# Patient Record
Sex: Female | Born: 1980 | Race: White | Hispanic: No | Marital: Married
Health system: Southern US, Community
[De-identification: ages and names within clinical notes are randomized; demographics above are authoritative.]

---

## 2020-11-26 ENCOUNTER — Emergency Department (HOSPITAL_COMMUNITY)
Admission: EM | Admit: 2020-11-26 | Discharge: 2020-11-26 | Disposition: A | Discharge status: Home / Self Care | Payer: BC Managed Care – PPO | Attending: Emergency Medicine | Admitting: Emergency Medicine

## 2020-11-26 ENCOUNTER — Encounter (HOSPITAL_COMMUNITY): Payer: Self-pay | Admitting: *Deleted

## 2020-11-26 ENCOUNTER — Emergency Department (HOSPITAL_COMMUNITY): Payer: BC Managed Care – PPO

## 2020-11-26 ENCOUNTER — Other Ambulatory Visit: Payer: Self-pay

## 2020-11-26 DIAGNOSIS — R0989 Other specified symptoms and signs involving the circulatory and respiratory systems: Secondary | ICD-10-CM | POA: Diagnosis not present

## 2020-11-26 DIAGNOSIS — R070 Pain in throat: Secondary | ICD-10-CM | POA: Diagnosis not present

## 2020-11-26 NOTE — ED Provider Notes (Signed)
[facility]-EMERGENCY DEPT Provider Note   CSN: [id number] Arrival date & time: 11/26/20  1433     History Chief Complaint  Patient presents with   Swallowed Foreign Body    Joyce Freeman is a 40 y.o. female presenting to the ED for throat irritation.  States that a week ago she ate fish.  She woke up yesterday feeling like there was a fishbone stuck in her throat.  She did not eat any fish the day before that.  She pizza for dinner the night before.  She tried to see if symptoms spontaneously improved but they did not so she decided to seek evaluation in the ER.  Denies any trouble breathing, trouble swallowing.  She states that this feels different than it usual sore throat because there is "something jabbing me in my throat."  No lip or tongue swelling.  No vomiting.  HPI     History reviewed. No pertinent past medical history.  There are no problems to display for this patient.   History reviewed. No pertinent surgical history.   OB History   No obstetric history on file.     No family history on file.     Home Medications Prior to Admission medications   Not on File    Allergies    Patient has no allergy information on record.  Review of Systems   Review of Systems  Constitutional:  Negative for chills and fever.  HENT:  Positive for sore throat.   Neurological:  Negative for weakness and numbness.   Physical Exam Updated Vital Signs BP 130/88   Pulse 71   Temp 98.3 F (36.8 C) (Oral)   Resp 20   LMP 11/20/2020   SpO2 100%   Physical Exam Vitals and nursing note reviewed.  Constitutional:      General: She is not in acute distress.    Appearance: She is well-developed. She is not diaphoretic.     Comments: Tolerating secretions.  No signs of respiratory distress.  HENT:     Head: Normocephalic and atraumatic.     Mouth/Throat:     Pharynx: Uvula midline. No oropharyngeal exudate.     Tonsils: 0 on the right. 0 on the left.      Comments: Patient does not appear to be in acute distress. No trismus or drooling present. No pooling of secretions. Patient is tolerating secretions and is not in respiratory distress. No neck pain or tenderness to palpation of the neck. Full active and passive range of motion of the neck. No evidence of RPA or PTA. Eyes:     General: No scleral icterus.    Conjunctiva/sclera: Conjunctivae normal.  Cardiovascular:     Rate and Rhythm: Normal rate and regular rhythm.     Heart sounds: Normal heart sounds.  Pulmonary:     Effort: Pulmonary effort is normal. No respiratory distress.  Musculoskeletal:     Cervical back: Normal range of motion.  Skin:    Findings: No rash.  Neurological:     Mental Status: She is alert.    ED Results / Procedures / Treatments   Labs (all labs ordered are listed, but only abnormal results are displayed) Labs Reviewed - No data to display  EKG None  Radiology No results found.  Procedures Procedures   Medications Ordered in ED Medications - No data to display  ED Course  I have reviewed the triage vital signs and the nursing notes.  Pertinent labs & imaging  results that were available during my care of the patient were reviewed by me and considered in my medical decision making (see chart for details).  Clinical Course as of 11/26/20 1942  Tue Nov 26, 2020  2751 CLINICAL DATA: Globus sensation  EXAM: CT NECK WITHOUT CONTRAST  TECHNIQUE: Multidetector CT imaging of the neck was performed following the standard protocol without intravenous contrast.  COMPARISON: None.  FINDINGS: PHARYNX AND LARYNX: The nasopharynx, oropharynx and larynx are normal. Visible portions of the oral cavity, tongue base and floor of mouth are normal. Normal epiglottis, vallecula and pyriform sinuses. The larynx is normal. No retropharyngeal abscess, effusion or lymphadenopathy.  SALIVARY GLANDS: Normal parotid, submandibular and  sublingual glands.  THYROID: Normal.  LYMPH NODES: No enlarged or abnormal density lymph nodes.  VASCULAR: Major cervical vessels are patent.  LIMITED INTRACRANIAL: Normal.  VISUALIZED ORBITS: Normal.  MASTOIDS AND VISUALIZED PARANASAL SINUSES: No fluid levels or advanced mucosal thickening. No mastoid effusion.  SKELETON: No bony spinal canal stenosis. No lytic or blastic lesions.  UPPER CHEST: Clear.  OTHER: None.  IMPRESSION: Normal CT of the neck. No foreign body visible in the aero digestive tract   Electronically Signed By: Deatra Robinson M.D. On: 11/26/2020 19:28 [HK]    Clinical Course User Index [HK] Dietrich Pates, PA-C   MDM Rules/Calculators/A&P                          40 year old female presenting to the ED for throat irritation.  She ate fish last week and is concerned that there is a fishbone stuck in her throat.  Started having the sensation yesterday but did not eat fish again recently.  On exam she is tolerating secretions.  No trismus or drooling.  No signs of respiratory distress.  Exam of the posterior oropharynx without any abnormalities.  I have low suspicion for food impaction as the meal that she ate was 1 week ago and she is tolerating her secretions.  I feel this is most likely due to irritation but will proceed with imaging to rule out other cause.  CT soft tissue neck without any foreign bodies or other abnormalities.  I suspect this could be irritation but no indication that this is a food impaction at this time.  Will refer to GI for symptoms persist.  In the meantime we will advise her to take over-the-counter medication as needed for pain.  She remains in no acute distress, tolerating secretions and no signs of respiratory distress.  Return precautions given.    Patient is hemodynamically stable, in NAD, and able to ambulate in the ED. Evaluation does not show pathology that would require ongoing emergent intervention or inpatient treatment. I  explained the diagnosis to the patient. Pain has been managed and has no complaints prior to discharge. Patient is comfortable with above plan and is stable for discharge at this time. All questions were answered prior to disposition. Strict return precautions for returning to the ED were discussed. Encouraged follow up with PCP.   An After Visit Summary was printed and given to the patient.   Portions of this note were generated with Scientist, clinical (histocompatibility and immunogenetics). Dictation errors may occur despite best attempts at proofreading.   Final Clinical Impression(s) / ED Diagnoses Final diagnoses:  Foreign body sensation in throat    Rx / DC Orders ED Discharge Orders     None        Dietrich Pates, PA-C 11/26/20 1942  Vanetta Mulders, MD 11/29/20 [phone]

## 2020-11-26 NOTE — ED Triage Notes (Signed)
Pt complains of sore throat since last night. She feels like it is from a fishbone that is stuck in her throat. She ate some fish last week.

## 2020-11-26 NOTE — Discharge Instructions (Addendum)
You can take Tylenol ibuprofen as needed for discomfort. You may benefit you to follow-up with a GI specialist listed below. Return to the ER if you start to experience trouble breathing, trouble swallowing, trouble opening your mouth.

## 2022-06-13 IMAGING — CT CT NECK W/O CM
4 series · 15 of 33 positions shown, 18 images · non-contrast
Comparison: None.

CLINICAL DATA: Globus sensation

EXAM:
CT NECK WITHOUT CONTRAST
TECHNIQUE: Multidetector CT imaging of the neck was performed following the
standard protocol without intravenous contrast.

[Series 3: axial neck · axial · 0.46mm/px · z∈[+1278,+1430]mm · 5 of 114 slices shown, 7 images]
[im 19/114  soft-tissue]
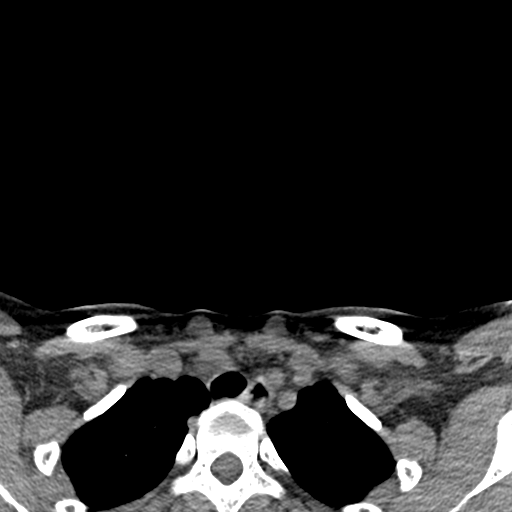
[im 19/114  bone]
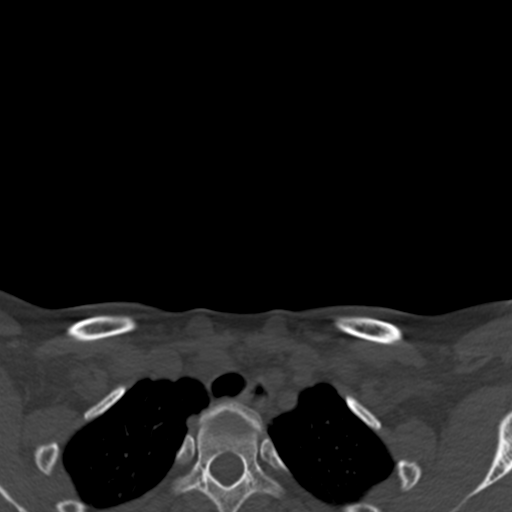
[im 38/114  bone]
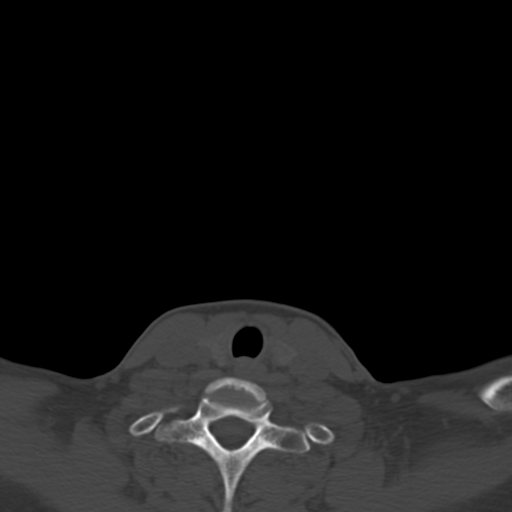
[im 57/114  bone]
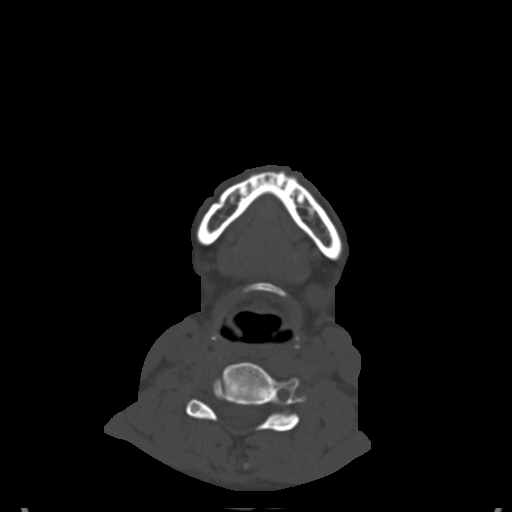
[im 76/114  bone]
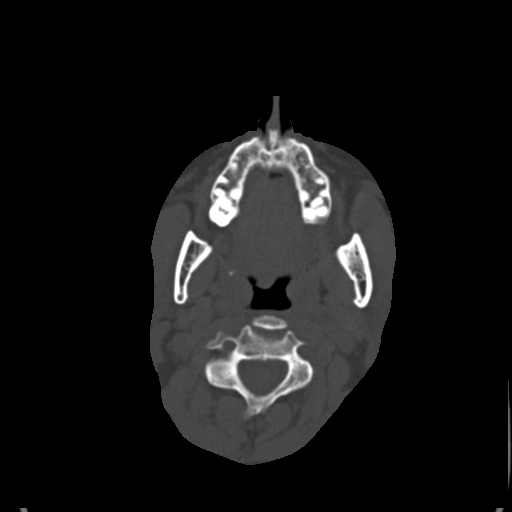
[im 95/114  soft-tissue]
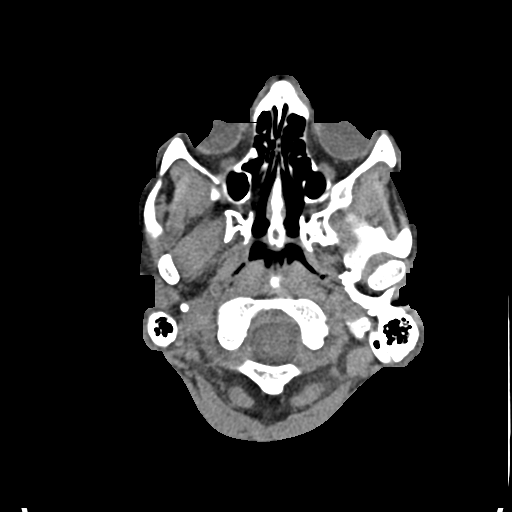
[im 95/114  bone]
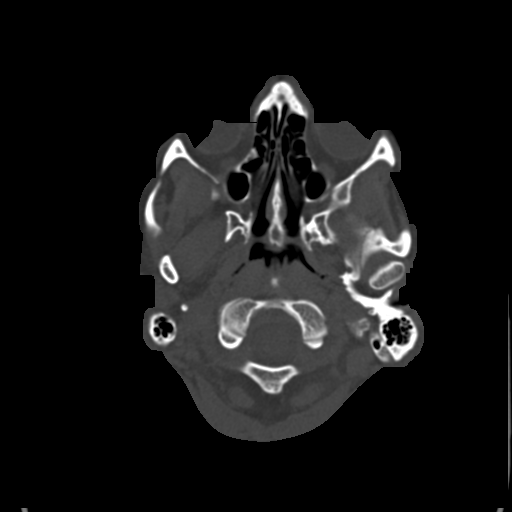

[Series 6: axial · axial · 0.44mm/px · z∈[+1278,+1316]mm · 2 of 115 slices shown]
[im 20/115  bone]
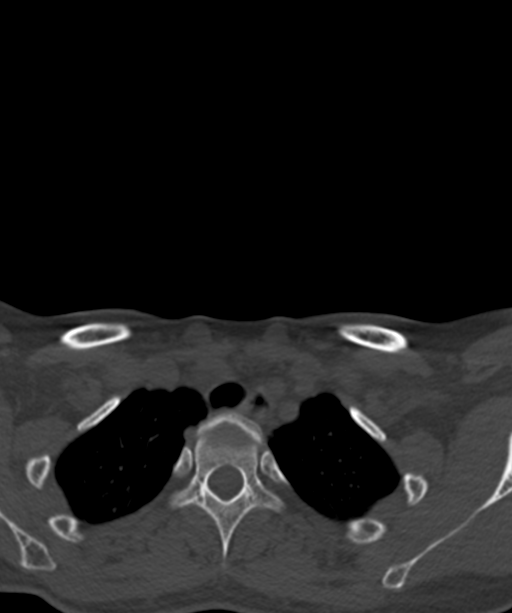
[im 39/115  bone]
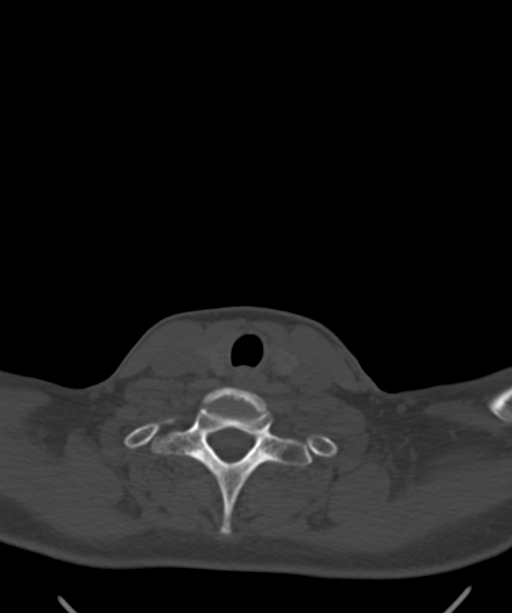

[Series 7: coronal · coronal · 0.45mm/px · 3 of 125 slices shown]
[im 25/125  bone]
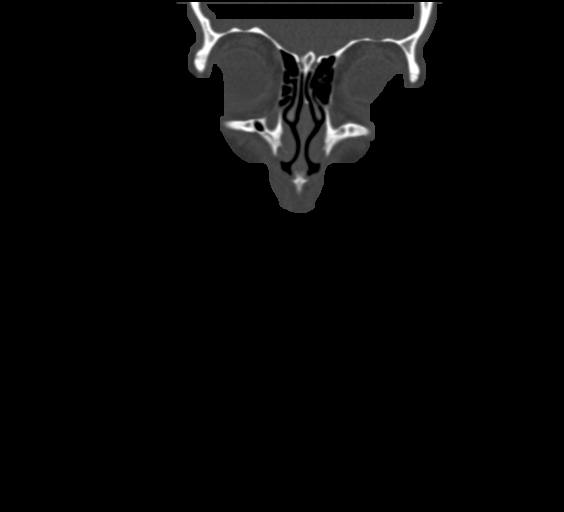
[im 50/125  bone]
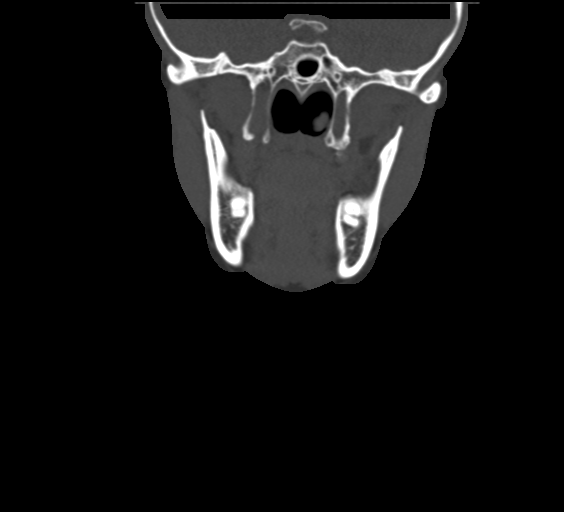
[im 75/125  bone]
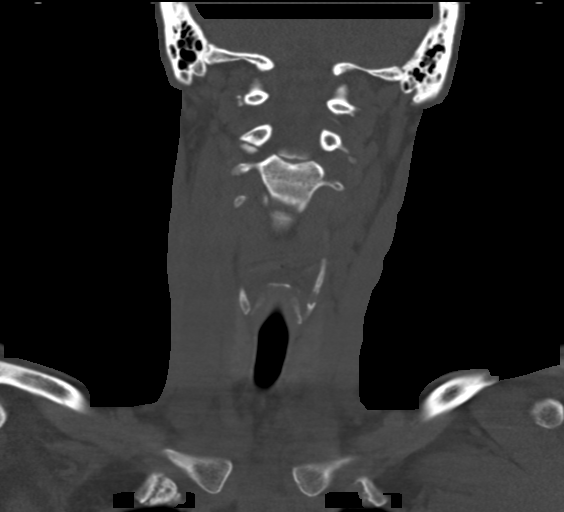

[Series 8: sagittal · sagittal · 0.44mm/px · 5 of 101 slices shown, 6 images]
[im 34/101  bone]
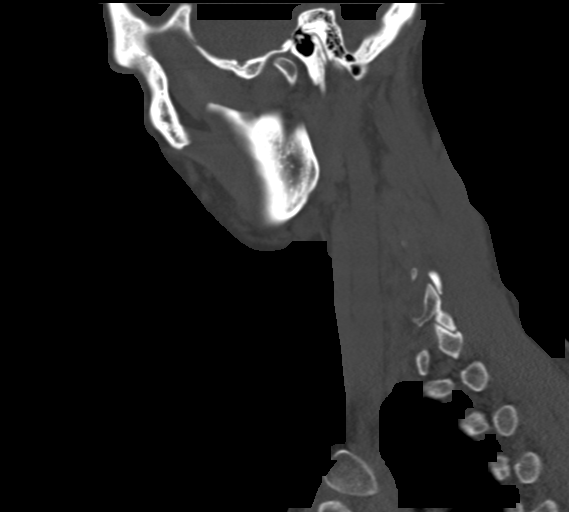
[im 42/101  bone]
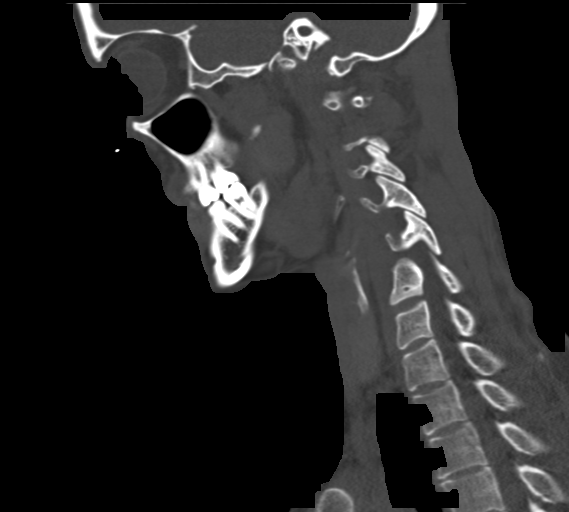
[im 51/101  soft-tissue]
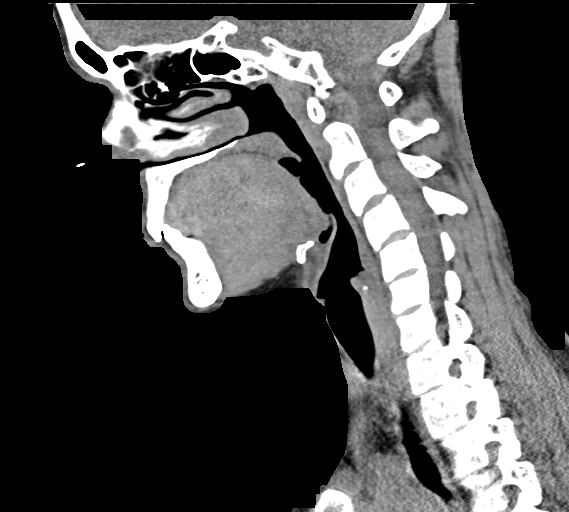
[im 51/101  bone]
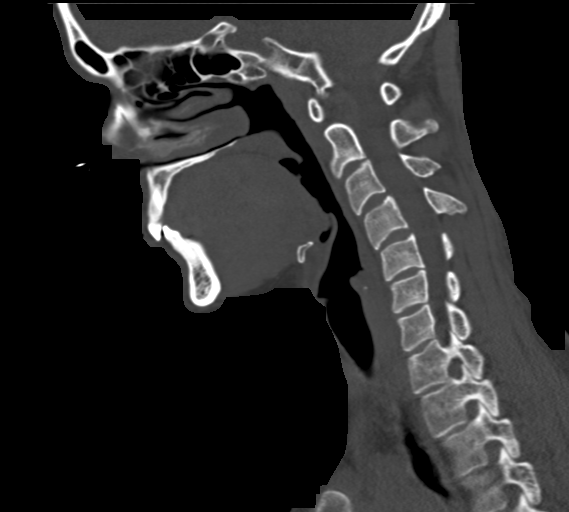
[im 59/101  bone]
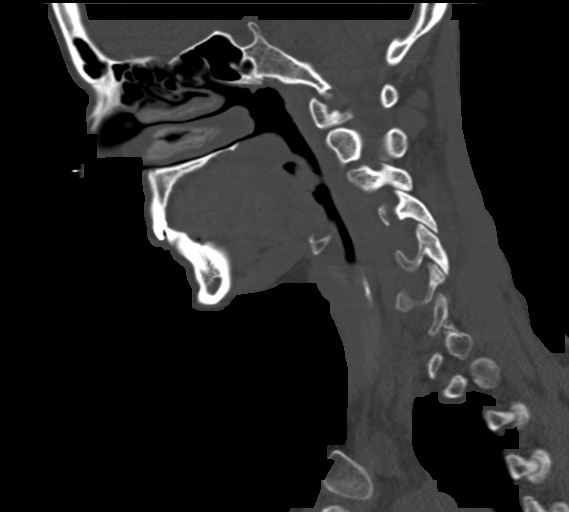
[im 67/101  bone]
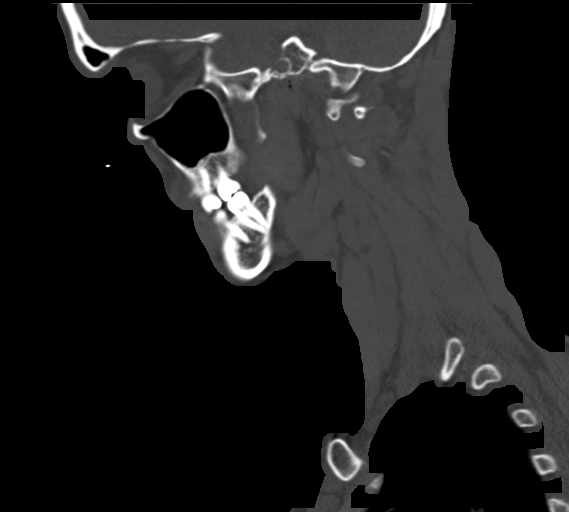

[15 of 33 positions shown; findings below may reference images not displayed]

FINDINGS: PHARYNX AND LARYNX: The nasopharynx, oropharynx and larynx are
normal. Visible portions of the oral cavity, tongue base and floor
of mouth are normal. Normal epiglottis, vallecula and pyriform
sinuses. The larynx is normal. No retropharyngeal abscess, effusion
or lymphadenopathy.

SALIVARY GLANDS: Normal parotid, submandibular and sublingual
glands.

THYROID: Normal.

LYMPH NODES: No enlarged or abnormal density lymph nodes.

VASCULAR: Major cervical vessels are patent.

LIMITED INTRACRANIAL: Normal.

VISUALIZED ORBITS: Normal.

MASTOIDS AND VISUALIZED PARANASAL SINUSES: No fluid levels or
advanced mucosal thickening. No mastoid effusion.

SKELETON: No bony spinal canal stenosis. No lytic or blastic
lesions.

UPPER CHEST: Clear.

OTHER: None.
IMPRESSION: Normal CT of the neck. No foreign body visible in the aero digestive
tract
